# Patient Record
Sex: Male | Born: 1995 | Race: White | Hispanic: No | Marital: Single | Smoking: Current every day smoker
Health system: Southern US, Community
[De-identification: ages and names within clinical notes are randomized; demographics above are authoritative.]

## PROBLEM LIST (undated history)

## (undated) DIAGNOSIS — F1920 Other psychoactive substance dependence, uncomplicated: Secondary | ICD-10-CM

## (undated) DIAGNOSIS — F329 Major depressive disorder, single episode, unspecified: Secondary | ICD-10-CM

## (undated) DIAGNOSIS — F32A Depression, unspecified: Secondary | ICD-10-CM

---
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
## 2016-04-20 ENCOUNTER — Encounter (HOSPITAL_COMMUNITY): Payer: Self-pay | Admitting: Emergency Medicine

## 2016-04-20 ENCOUNTER — Emergency Department (HOSPITAL_COMMUNITY)
Admission: EM | Admit: 2016-04-20 | Discharge: 2016-04-20 | Disposition: A | Discharge status: Home / Self Care | Payer: BLUE CROSS/BLUE SHIELD | Attending: Emergency Medicine | Admitting: Emergency Medicine

## 2016-04-20 DIAGNOSIS — R45851 Suicidal ideations: Secondary | ICD-10-CM

## 2016-04-20 DIAGNOSIS — F329 Major depressive disorder, single episode, unspecified: Secondary | ICD-10-CM | POA: Diagnosis not present

## 2016-04-20 DIAGNOSIS — Z79899 Other long term (current) drug therapy: Secondary | ICD-10-CM | POA: Diagnosis not present

## 2016-04-20 DIAGNOSIS — F17210 Nicotine dependence, cigarettes, uncomplicated: Secondary | ICD-10-CM | POA: Insufficient documentation

## 2016-04-20 DIAGNOSIS — F1414 Cocaine abuse with cocaine-induced mood disorder: Secondary | ICD-10-CM | POA: Diagnosis not present

## 2016-04-20 DIAGNOSIS — F1410 Cocaine abuse, uncomplicated: Secondary | ICD-10-CM

## 2016-04-20 DIAGNOSIS — F32A Depression, unspecified: Secondary | ICD-10-CM

## 2016-04-20 LAB — COMPREHENSIVE METABOLIC PANEL
ALK PHOS: 79 U/L (ref 38–126)
ALT: 15 U/L — AB (ref 17–63)
ANION GAP: 10 (ref 5–15)
AST: 17 U/L (ref 15–41)
Albumin: 4.9 g/dL (ref 3.5–5.0)
BILIRUBIN TOTAL: 0.5 mg/dL (ref 0.3–1.2)
BUN: 14 mg/dL (ref 6–20)
CALCIUM: 9.1 mg/dL (ref 8.9–10.3)
CO2: 25 mmol/L (ref 22–32)
CREATININE: 0.85 mg/dL (ref 0.61–1.24)
Chloride: 103 mmol/L (ref 101–111)
Glucose, Bld: 86 mg/dL (ref 65–99)
Potassium: 3.7 mmol/L (ref 3.5–5.1)
SODIUM: 138 mmol/L (ref 135–145)
TOTAL PROTEIN: 7.4 g/dL (ref 6.5–8.1)

## 2016-04-20 LAB — CBC WITH DIFFERENTIAL/PLATELET
Basophils Absolute: 0 10*3/uL (ref 0.0–0.1)
Basophils Relative: 0 %
EOS ABS: 0.1 10*3/uL (ref 0.0–0.7)
EOS PCT: 2 %
HCT: 41.7 % (ref 39.0–52.0)
HEMOGLOBIN: 15.3 g/dL (ref 13.0–17.0)
LYMPHS ABS: 2.8 10*3/uL (ref 0.7–4.0)
LYMPHS PCT: 35 %
MCH: 33.1 pg (ref 26.0–34.0)
MCHC: 36.7 g/dL — ABNORMAL HIGH (ref 30.0–36.0)
MCV: 90.3 fL (ref 78.0–100.0)
Monocytes Absolute: 0.6 10*3/uL (ref 0.1–1.0)
Monocytes Relative: 7 %
NEUTROS PCT: 56 %
Neutro Abs: 4.6 10*3/uL (ref 1.7–7.7)
PLATELETS: 260 10*3/uL (ref 150–400)
RBC: 4.62 MIL/uL (ref 4.22–5.81)
RDW: 12.8 % (ref 11.5–15.5)
WBC: 8.1 10*3/uL (ref 4.0–10.5)

## 2016-04-20 LAB — RAPID URINE DRUG SCREEN, HOSP PERFORMED
Amphetamines: NOT DETECTED
BARBITURATES: NOT DETECTED
Benzodiazepines: POSITIVE — AB
Cocaine: POSITIVE — AB
Opiates: NOT DETECTED
Tetrahydrocannabinol: NOT DETECTED

## 2016-04-20 LAB — SALICYLATE LEVEL

## 2016-04-20 LAB — ETHANOL: Alcohol, Ethyl (B): 55 mg/dL — ABNORMAL HIGH (ref <5)

## 2016-04-20 LAB — ACETAMINOPHEN LEVEL

## 2016-04-20 MED ORDER — ACETAMINOPHEN 325 MG PO TABS: 650.0000 mg | ORAL_TABLET | ORAL | Status: DC | PRN

## 2016-04-20 MED ORDER — IBUPROFEN 200 MG PO TABS: 600.0000 mg | ORAL_TABLET | Freq: Three times a day (TID) | ORAL | Status: DC | PRN

## 2016-04-20 MED ORDER — NICOTINE 21 MG/24HR TD PT24
21.0000 mg | MEDICATED_PATCH | Freq: Every day | TRANSDERMAL | Status: DC
  Filled 2016-04-20: qty 1

## 2016-04-20 MED ORDER — ZOLPIDEM TARTRATE 10 MG PO TABS: 10.0000 mg | ORAL_TABLET | Freq: Every evening | ORAL | Status: DC | PRN

## 2016-04-20 MED ORDER — ONDANSETRON HCL 4 MG PO TABS: 4.0000 mg | ORAL_TABLET | Freq: Three times a day (TID) | ORAL | Status: DC | PRN

## 2016-04-20 MED ORDER — LORAZEPAM 1 MG PO TABS
1.0000 mg | ORAL_TABLET | Freq: Three times a day (TID) | ORAL | Status: DC | PRN
  Administered 2016-04-20: 1 mg via ORAL
  Filled 2016-04-20: qty 1

## 2016-04-20 MED ORDER — ALUM & MAG HYDROXIDE-SIMETH 200-200-20 MG/5ML PO SUSP: 30.0000 mL | ORAL | Status: DC | PRN
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
## 2016-04-20 NOTE — ED Provider Notes (Signed)
[facility]ER[facility]ENCY DEPT Provider Note   CSN: [id number] Arrival date & time: 04/20/16  0134 By signing my name below, I, Bridgette Habermann, attest that this documentation has been prepared under the direction and in the presence of Devoria Albe, MD. Electronically Signed: Bridgette Habermann, ED Scribe. 04/20/16. 3:39 AM.  Time seen 03:35 AM  History   Chief Complaint Chief Complaint  Patient presents with  . Suicidal   HPI Comments: Glen [name] is a 20 y.o. male with h/o depression and drug addiction who presents to the Emergency Department for suicidal ideations onset last night. Pt reports he is thinking of getting a knife and stabbing himself. Per pt, he has become more depressed due his dad's declining health. He states he has been taking total care of his dad and his house. He states he feels bad because he feels like he is watching his father die more every day. Pt was previously admitted to the hospital for his depression 2 years ago which he noted helped him.He was on medications at that time. He is not on medications for his depression at this time, and hasn't been for ~2 years. He denies trying to hurt himself anytime in the past. Pt is not a smoker. He denies HI, fever, or any other associated symptoms. Patient reports he always feels depressed and he's had this for years.  The history is provided by the patient. No language interpreter was used.    Past Medical History:  Diagnosis Date  . Depression   . Drug addiction [facility])     Patient Active Problem List   Diagnosis Date Noted  . Polysubstance abuse 05/19/2014  . Altered mental status   . Polysubstance dependence (HCC) 01/05/2014  . ODD (oppositional defiant disorder) 01/05/2014  . MDD (major depressive disorder), single episode, moderate (HCC) 01/04/2014    History reviewed. No pertinent surgical history.     Home Medications    Prior to Admission medications   Medication Sig Start Date End Date Taking? Authorizing Provider    buPROPion (WELLBUTRIN XL) 300 MG 24 hr tablet Take 1 tablet (300 mg total) by mouth daily. Patient not taking: Reported on 04/20/2016 01/09/14   Kendrick Fries, NP  hydrOXYzine (ATARAX/VISTARIL) 50 MG tablet Take 1 tablet (50 mg total) by mouth at bedtime as needed (insomnia). Patient not taking: Reported on 05/19/2014 01/09/14   Kendrick Fries, NP    Family History History reviewed. No pertinent family history.  Social History Social History  Substance Use Topics  . Smoking status: Current Every Day Smoker    Packs/day: 1.00    Types: Cigarettes  . Smokeless tobacco: Never Used  . Alcohol use Yes     Comment: 2 beers this week  unemployed On probation Denies smoking Denies alcohol use   Allergies   Patient has no known allergies.   Review of Systems Review of Systems  Constitutional: Negative for fever.  Psychiatric/Behavioral: Positive for suicidal ideas.  All other systems reviewed and are negative.    Physical Exam Updated Vital Signs BP 143/76 (BP Location: Right Arm)   Pulse 100   Temp 98.3 F (36.8 C) (Oral)   Resp 20   SpO2 99%   Vital signs normal    Physical Exam  Constitutional: He appears well-developed and well-nourished.  HENT:  Head: Normocephalic.  Eyes: Conjunctivae are normal.  Cardiovascular: Normal rate, regular rhythm and normal heart sounds.  Exam reveals no gallop and no friction rub.   No murmur heard. Pulmonary/Chest: Effort normal  and breath sounds normal. No respiratory distress. He has no wheezes. He has no rales.  Abdominal: He exhibits no distension.  Musculoskeletal: Normal range of motion.  Neurological: He is alert.  Skin: Skin is warm and dry.  Psychiatric: He has a normal mood and affect. His behavior is normal. He expresses suicidal ideation. He expresses suicidal plans.  Nursing note and vitals reviewed.    ED Treatments / Results  DIAGNOSTIC STUDIES: Oxygen Saturation is 99% on RA, normal by my  interpretation.     Labs (all labs ordered are listed, but only abnormal results are displayed) Results for orders placed or performed during the hospital encounter of 04/20/16  Comprehensive metabolic panel  Result Value Ref Range   Sodium 138 135 - 145 mmol/L   Potassium 3.7 3.5 - 5.1 mmol/L   Chloride 103 101 - 111 mmol/L   CO2 25 22 - 32 mmol/L   Glucose, Bld 86 65 - 99 mg/dL   BUN 14 6 - 20 mg/dL   Creatinine, Ser 9.560.85 0.61 - 1.24 mg/dL   Calcium 9.1 8.9 - 21.310.3 mg/dL   Total Protein 7.4 6.5 - 8.1 g/dL   Albumin 4.9 3.5 - 5.0 g/dL   AST 17 15 - 41 U/L   ALT 15 (L) 17 - 63 U/L   Alkaline Phosphatase 79 38 - 126 U/L   Total Bilirubin 0.5 0.3 - 1.2 mg/dL   GFR calc non Af Amer >60 >60 mL/min   GFR calc Af Amer >60 >60 mL/min   Anion gap 10 5 - 15  Ethanol  Result Value Ref Range   Alcohol, Ethyl (B) 55 (H) <5 mg/dL  Salicylate level  Result Value Ref Range   Salicylate Lvl <7.0 2.8 - 30.0 mg/dL  Acetaminophen level  Result Value Ref Range   Acetaminophen (Tylenol), Serum <10 (L) 10 - 30 ug/mL  Rapid urine drug screen (hospital performed)  Result Value Ref Range   Opiates NONE DETECTED NONE DETECTED   Cocaine POSITIVE (A) NONE DETECTED   Benzodiazepines POSITIVE (A) NONE DETECTED   Amphetamines NONE DETECTED NONE DETECTED   Tetrahydrocannabinol NONE DETECTED NONE DETECTED   Barbiturates NONE DETECTED NONE DETECTED  CBC with Differential  Result Value Ref Range   WBC 8.1 4.0 - 10.5 K/uL   RBC 4.62 4.22 - 5.81 MIL/uL   Hemoglobin 15.3 13.0 - 17.0 g/dL   HCT 08.641.7 57.839.0 - 46.952.0 %   MCV 90.3 78.0 - 100.0 fL   MCH 33.1 26.0 - 34.0 pg   MCHC 36.7 (H) 30.0 - 36.0 g/dL   RDW 62.912.8 52.811.5 - 41.315.5 %   Platelets 260 150 - 400 K/uL   Neutrophils Relative % 56 %   Neutro Abs 4.6 1.7 - 7.7 K/uL   Lymphocytes Relative 35 %   Lymphs Abs 2.8 0.7 - 4.0 K/uL   Monocytes Relative 7 %   Monocytes Absolute 0.6 0.1 - 1.0 K/uL   Eosinophils Relative 2 %   Eosinophils Absolute 0.1 0.0 -  0.7 K/uL   Basophils Relative 0 %   Basophils Absolute 0.0 0.0 - 0.1 K/uL   Laboratory interpretation all normal except Positive UDS, positive alcohol level despite denying alcohol use    EKG  EKG Interpretation None       Radiology No results found.  Procedures Procedures (including critical care time)  Medications Ordered in ED Medications  acetaminophen (TYLENOL) tablet 650 mg (not administered)  ibuprofen (ADVIL,MOTRIN) tablet 600 mg (not administered)  zolpidem (AMBIEN)  tablet 10 mg (not administered)  nicotine (NICODERM CQ - dosed in mg/24 hours) patch 21 mg (not administered)  ondansetron (ZOFRAN) tablet 4 mg (not administered)  alum & mag hydroxide-simeth (MAALOX/MYLANTA) 200-200-20 MG/5ML suspension 30 mL (not administered)     Initial Impression / Assessment and Plan / ED Course  I have reviewed the triage vital signs and the nursing notes.  Pertinent labs & imaging results that were available during my care of the patient were reviewed by me and considered in my medical decision making (see chart for details).  Clinical Course     COORDINATION OF CARE: 3:39 AM Discussed treatment plan with pt at bedside which includes blood work and pt agreed to plan. Patient will have mental health evaluation once medically cleared.   [facility] They recommended inpatient admission.  Final Clinical Impressions(s) / ED Diagnoses   Final diagnoses:  Depression, unspecified depression type  Suicidal ideation  Cocaine abuse    Plan inpatient psychiatric admission  Devoria AlbeIva Raunak Antuna, MD, FACEP    I personally performed the services described in this documentation, which was scribed in my presence. The recorded information has been reviewed and considered.  Devoria AlbeIva Karla Vines, MD, Concha PyoFACEP     Rangel Echeverri, MD 04/20/16 [phone]

## 2016-04-20 NOTE — BH Assessment (Signed)
[facility] Assessment Progress Note  Per Leata MouseJanardhana Jonnalagadda, MD, this pt does not require psychiatric hospitalization at this time.  [facility] information for area substance abuse treatment providers.  Information for the [facility] Dependency Int[facility] Outpatient Programs at [facility], [facility], and [facility] have been included in pt's discharge instructions.  Pt's nurse, Toni AmendCourtney, has been notified.  Doylene Canninghomas Angeline Trick, MA Triage Specialist [phone]

## 2016-04-20 NOTE — ED Triage Notes (Signed)
Patient wanted to kill himself. Patient was wanting to cut hisself. Patient is sad due to dad's failing health.

## 2016-04-20 NOTE — ED Notes (Signed)
ED Provider at bedside. 

## 2016-04-20 NOTE — ED Notes (Addendum)
Pt is sitting in his bed, rocking back and forth. When asked if he was ok, he replied, "No, he was having withdrawal from Xanax." He admits that he does not have a prescription for Xanax but take 2 mg up to 4 times daily.

## 2016-04-20 NOTE — ED Notes (Signed)
[facility] and get ETA on bed availablility.  [facility]

## 2016-04-20 NOTE — ED Notes (Signed)
RN spoke with Pts father, Trey PaulaJeff, with Pts permission at [phone] to update him on plan for Pt DC and plan for Pt to order his own Benedetto [employer]oad[employer]ber to get home. (Pts plan, not RNs)
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
## 2016-04-20 NOTE — Discharge Instructions (Signed)
To help you maintain a sober lifestyle, a substance abuse treatment program may be beneficial to you.  The providers listed below offer Chemical Dependency Intensive Outpatient Programs.  These are structured programs that meet several hours a day, several times a week.  Contact them at your earliest opportunity to ask about enrolling in their program:       [facility]      [address]      [place], [place] [address]      [phone] or [phone]      Contact person: Charmian Muffnn Evans, LCAS       [facility]      [address]      [place], [place] [address]      [phone]      New patients are seen at the walk-in clinic every Tuesday from 9:00 am - 12:00 pm.       [facility]      [address]      [place], [place] [address]      [phone]

## 2016-04-20 NOTE — ED Notes (Signed)
RN called to bedside by Pt.  Pt states that he no longer has thoughts of harming self.  He, "just wants to go home and does not feel that way anymore."  RN notified [facility] MD.

## 2016-05-09 ENCOUNTER — Ambulatory Visit: Payer: Self-pay | Admitting: Family Medicine
